# Patient Record
Sex: Female | Born: 1995 | Race: White | Hispanic: No | Marital: Single | Smoking: Current some day smoker
Health system: Southern US, Community
[De-identification: ages and names within clinical notes are randomized; demographics above are authoritative.]

## PROBLEM LIST (undated history)

## (undated) DIAGNOSIS — F32A Depression, unspecified: Secondary | ICD-10-CM

## (undated) DIAGNOSIS — E669 Obesity, unspecified: Secondary | ICD-10-CM

## (undated) DIAGNOSIS — F329 Major depressive disorder, single episode, unspecified: Secondary | ICD-10-CM

## (undated) HISTORY — PX: NO PAST SURGERIES: SHX2092

---
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
## 2017-05-16 ENCOUNTER — Emergency Department (HOSPITAL_COMMUNITY): Payer: Self-pay

## 2017-05-16 ENCOUNTER — Emergency Department (HOSPITAL_COMMUNITY)
Admission: EM | Admit: 2017-05-16 | Discharge: 2017-05-17 | Disposition: A | Discharge status: Home / Self Care | Payer: Self-pay | Attending: Emergency Medicine | Admitting: Emergency Medicine

## 2017-05-16 ENCOUNTER — Encounter (HOSPITAL_COMMUNITY): Payer: Self-pay | Admitting: *Deleted

## 2017-05-16 DIAGNOSIS — J329 Chronic sinusitis, unspecified: Secondary | ICD-10-CM | POA: Insufficient documentation

## 2017-05-16 DIAGNOSIS — J40 Bronchitis, not specified as acute or chronic: Secondary | ICD-10-CM | POA: Insufficient documentation

## 2017-05-16 DIAGNOSIS — F17210 Nicotine dependence, cigarettes, uncomplicated: Secondary | ICD-10-CM | POA: Insufficient documentation

## 2017-05-16 MED ORDER — PSEUDOEPHEDRINE HCL 60 MG PO TABS
60.0000 mg | ORAL_TABLET | Freq: Once | ORAL | Status: AC
  Administered 2017-05-17: 60 mg via ORAL
  Filled 2017-05-16: qty 1

## 2017-05-16 MED ORDER — PREDNISONE 20 MG PO TABS
40.0000 mg | ORAL_TABLET | Freq: Once | ORAL | Status: AC
  Administered 2017-05-17: 40 mg via ORAL
  Filled 2017-05-16: qty 2

## 2017-05-16 MED ORDER — DEXAMETHASONE 4 MG PO TABS: 4.0000 mg | ORAL_TABLET | Freq: Two times a day (BID) | ORAL | 0 refills | Status: DC

## 2017-05-16 MED ORDER — IBUPROFEN 800 MG PO TABS
800.0000 mg | ORAL_TABLET | Freq: Once | ORAL | Status: AC
  Administered 2017-05-17: 800 mg via ORAL
  Filled 2017-05-16: qty 1

## 2017-05-16 MED ORDER — PROMETHAZINE-DM 6.25-15 MG/5ML PO SYRP: 5.0000 mL | ORAL_SOLUTION | Freq: Four times a day (QID) | ORAL | 0 refills | Status: DC | PRN

## 2017-05-16 MED ORDER — IBUPROFEN 600 MG PO TABS: 600.0000 mg | ORAL_TABLET | Freq: Four times a day (QID) | ORAL | 0 refills | Status: DC

## 2017-05-16 MED ORDER — HYDROCOD POLST-CPM POLST ER 10-8 MG/5ML PO SUER
5.0000 mL | Freq: Once | ORAL | Status: AC
  Administered 2017-05-17: 5 mL via ORAL
  Filled 2017-05-16: qty 5

## 2017-05-16 NOTE — Discharge Instructions (Signed)
Your vital signs are within normal limits. Your oxygen level is 99%, and your chest xray is negative for acute problem. Please increase water, Gatorade, Kool-Aid, etc. Use ibuprofen with breakfast, lunch, dinner, and bedtime. Use promethazine DM every 6 hours for cough. Use decadron 2 times daily with food. Use claritin D every 12 hours (over the counter) for congestion.

## 2017-05-16 NOTE — ED Triage Notes (Signed)
Pt reports cough, congestion, sob, sore throat x 1 month. Pt reports intermittent chest pain since last night when she takes a deep breath, coughs, or sits up.
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
## 2017-05-17 NOTE — ED Notes (Signed)
Pt ambulatory to waiting room. Pt verbalized understanding of discharge instructions.   

## 2017-06-23 ENCOUNTER — Encounter (HOSPITAL_COMMUNITY): Payer: Self-pay | Admitting: *Deleted

## 2017-06-23 ENCOUNTER — Emergency Department (HOSPITAL_COMMUNITY)
Admission: EM | Admit: 2017-06-23 | Discharge: 2017-06-23 | Disposition: A | Discharge status: Home / Self Care | Payer: Self-pay | Attending: Emergency Medicine | Admitting: Emergency Medicine

## 2017-06-23 DIAGNOSIS — K029 Dental caries, unspecified: Secondary | ICD-10-CM

## 2017-06-23 DIAGNOSIS — Z791 Long term (current) use of non-steroidal anti-inflammatories (NSAID): Secondary | ICD-10-CM | POA: Insufficient documentation

## 2017-06-23 DIAGNOSIS — K0889 Other specified disorders of teeth and supporting structures: Secondary | ICD-10-CM | POA: Insufficient documentation

## 2017-06-23 DIAGNOSIS — Z79899 Other long term (current) drug therapy: Secondary | ICD-10-CM | POA: Insufficient documentation

## 2017-06-23 DIAGNOSIS — F17210 Nicotine dependence, cigarettes, uncomplicated: Secondary | ICD-10-CM | POA: Insufficient documentation

## 2017-06-23 MED ORDER — PENICILLIN V POTASSIUM 500 MG PO TABS: 500.0000 mg | ORAL_TABLET | Freq: Four times a day (QID) | ORAL | 0 refills | Status: AC

## 2017-06-23 MED ORDER — IBUPROFEN 400 MG PO TABS
600.0000 mg | ORAL_TABLET | Freq: Once | ORAL | Status: AC
  Administered 2017-06-23: 600 mg via ORAL
  Filled 2017-06-23: qty 2

## 2017-06-23 MED ORDER — ACETAMINOPHEN 500 MG PO TABS
1000.0000 mg | ORAL_TABLET | Freq: Once | ORAL | Status: AC
  Administered 2017-06-23: 1000 mg via ORAL
  Filled 2017-06-23: qty 2

## 2017-06-23 MED ORDER — PENICILLIN V POTASSIUM 250 MG PO TABS
500.0000 mg | ORAL_TABLET | Freq: Once | ORAL | Status: AC
  Administered 2017-06-23: 500 mg via ORAL
  Filled 2017-06-23: qty 2
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
## 2017-06-23 NOTE — ED Triage Notes (Signed)
Pt C/O dental pain for the past 3 days.

## 2017-06-23 NOTE — Discharge Instructions (Signed)
You can take ibuprofen 600 mg + acetaminophen 1000 mg 4 times a day for pain as needed. Take the antibiotic (penicillin) until gone. It is $4 at [employer] [facility]. You will need to see a dentist to get definitive care of your teeth problems.   Return to the ED if you get a fever, facial swelling or difficulty breathing or swallowing.

## 2019-10-13 ENCOUNTER — Other Ambulatory Visit: Payer: Self-pay

## 2019-10-13 ENCOUNTER — Emergency Department: Payer: Self-pay

## 2019-10-13 ENCOUNTER — Emergency Department
Admission: EM | Admit: 2019-10-13 | Discharge: 2019-10-13 | Disposition: A | Discharge status: Home / Self Care | Payer: Self-pay | Attending: Emergency Medicine | Admitting: Emergency Medicine

## 2019-10-13 DIAGNOSIS — Y999 Unspecified external cause status: Secondary | ICD-10-CM | POA: Diagnosis not present

## 2019-10-13 DIAGNOSIS — M79671 Pain in right foot: Secondary | ICD-10-CM | POA: Diagnosis not present

## 2019-10-13 DIAGNOSIS — Y92410 Unspecified street and highway as the place of occurrence of the external cause: Secondary | ICD-10-CM | POA: Diagnosis not present

## 2019-10-13 DIAGNOSIS — M25512 Pain in left shoulder: Secondary | ICD-10-CM | POA: Diagnosis not present

## 2019-10-13 DIAGNOSIS — Z79899 Other long term (current) drug therapy: Secondary | ICD-10-CM | POA: Diagnosis not present

## 2019-10-13 DIAGNOSIS — Y9389 Activity, other specified: Secondary | ICD-10-CM | POA: Diagnosis not present

## 2019-10-13 DIAGNOSIS — R109 Unspecified abdominal pain: Secondary | ICD-10-CM | POA: Insufficient documentation

## 2019-10-13 DIAGNOSIS — M542 Cervicalgia: Secondary | ICD-10-CM | POA: Diagnosis not present

## 2019-10-13 DIAGNOSIS — F17210 Nicotine dependence, cigarettes, uncomplicated: Secondary | ICD-10-CM | POA: Diagnosis not present

## 2019-10-13 DIAGNOSIS — R519 Headache, unspecified: Secondary | ICD-10-CM | POA: Diagnosis not present

## 2019-10-13 DIAGNOSIS — M25562 Pain in left knee: Secondary | ICD-10-CM | POA: Insufficient documentation

## 2019-10-13 DIAGNOSIS — M25511 Pain in right shoulder: Secondary | ICD-10-CM | POA: Insufficient documentation

## 2019-10-13 LAB — CBC WITH DIFFERENTIAL/PLATELET
Abs Immature Granulocytes: 0.11 10*3/uL — ABNORMAL HIGH (ref 0.00–0.07)
Basophils Absolute: 0.1 10*3/uL (ref 0.0–0.1)
Basophils Relative: 1 %
Eosinophils Absolute: 0.2 10*3/uL (ref 0.0–0.5)
Eosinophils Relative: 2 %
HCT: 44 % (ref 36.0–46.0)
Hemoglobin: 15.3 g/dL — ABNORMAL HIGH (ref 12.0–15.0)
Immature Granulocytes: 1 %
Lymphocytes Relative: 29 %
Lymphs Abs: 4.1 10*3/uL — ABNORMAL HIGH (ref 0.7–4.0)
MCH: 31.6 pg (ref 26.0–34.0)
MCHC: 34.8 g/dL (ref 30.0–36.0)
MCV: 90.9 fL (ref 80.0–100.0)
Monocytes Absolute: 0.7 10*3/uL (ref 0.1–1.0)
Monocytes Relative: 5 %
Neutro Abs: 8.8 10*3/uL — ABNORMAL HIGH (ref 1.7–7.7)
Neutrophils Relative %: 62 %
Platelets: 344 10*3/uL (ref 150–400)
RBC: 4.84 MIL/uL (ref 3.87–5.11)
RDW: 12 % (ref 11.5–15.5)
WBC: 14.1 10*3/uL — ABNORMAL HIGH (ref 4.0–10.5)
nRBC: 0 % (ref 0.0–0.2)

## 2019-10-13 LAB — BASIC METABOLIC PANEL
Anion gap: 10 (ref 5–15)
BUN: 7 mg/dL (ref 6–20)
CO2: 23 mmol/L (ref 22–32)
Calcium: 9.4 mg/dL (ref 8.9–10.3)
Chloride: 110 mmol/L (ref 98–111)
Creatinine, Ser: 0.72 mg/dL (ref 0.44–1.00)
GFR calc Af Amer: >60 mL/min (ref >60)
GFR calc non Af Amer: >60 mL/min (ref >60)
Glucose, Bld: 128 mg/dL — ABNORMAL HIGH (ref 70–99)
Potassium: 3.6 mmol/L (ref 3.5–5.1)
Sodium: 143 mmol/L (ref 135–145)

## 2019-10-13 LAB — POCT PREGNANCY, URINE: Preg Test, Ur: NEGATIVE

## 2019-10-13 LAB — HCG, QUANTITATIVE, PREGNANCY: hCG, Beta Chain, Quant, S: <1 m[IU]/mL (ref <5)
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
## 2019-10-13 MED ORDER — IOHEXOL 300 MG/ML  SOLN
100.0000 mL | Freq: Once | INTRAMUSCULAR | Status: AC | PRN
  Administered 2019-10-13: 02:00:00 100 mL via INTRAVENOUS
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
## 2021-06-01 NOTE — Congregational Nurse Program (Signed)
Client enrolled today in [facility]. Client wishes to establish care with [facility]. Appointment secured for 06/06/21 at 1PM appointment card given along with this RN's contact information for any further needs. Plan: follow up with client after initial visit to [facility].  Francee Nodal RN [employer]

## 2021-06-09 ENCOUNTER — Other Ambulatory Visit: Payer: Self-pay | Admitting: Physician Assistant

## 2021-06-09 ENCOUNTER — Encounter: Payer: Self-pay | Admitting: Physician Assistant

## 2021-06-09 ENCOUNTER — Other Ambulatory Visit: Payer: Self-pay

## 2021-06-09 ENCOUNTER — Ambulatory Visit: Payer: BC Managed Care – PPO | Admitting: Physician Assistant

## 2021-06-09 VITALS — BP 144/73 | HR 81 | Temp 97.8°F | Ht 69.0 in | Wt 234.0 lb

## 2021-06-09 DIAGNOSIS — F17200 Nicotine dependence, unspecified, uncomplicated: Secondary | ICD-10-CM

## 2021-06-09 DIAGNOSIS — E669 Obesity, unspecified: Secondary | ICD-10-CM

## 2021-06-09 DIAGNOSIS — Z7689 Persons encountering health services in other specified circumstances: Secondary | ICD-10-CM

## 2021-06-09 DIAGNOSIS — R030 Elevated blood-pressure reading, without diagnosis of hypertension: Secondary | ICD-10-CM

## 2021-06-09 NOTE — Patient Instructions (Signed)
https://www.nhlbi.nih.gov/files/docs/public/heart/dash_brief.pdf">  DASH Eating Plan DASH stands for Dietary Approaches to Stop Hypertension. The DASH eating plan is a healthy eating plan that has been shown to: Reduce high blood pressure (hypertension). Reduce your risk for type 2 diabetes, heart disease, and stroke. Help with weight loss. What are tips for following this plan? Reading food labels Check food labels for the amount of salt (sodium) per serving. Choose foods with less than 5 percent of the Daily Value of sodium. Generally, foods with less than 300 milligrams (mg) of sodium per serving fit into this eating plan. To find whole grains, look for the word "whole" as the first word in the ingredient list. Shopping Buy products labeled as "low-sodium" or "no salt added." Buy fresh foods. Avoid canned foods and pre-made or frozen meals. Cooking Avoid adding salt when cooking. Use salt-free seasonings or herbs instead of table salt or sea salt. Check with your health care provider or pharmacist before using salt substitutes. Do not fry foods. Cook foods using healthy methods such as baking, boiling, grilling, roasting, and broiling instead. Cook with heart-healthy oils, such as olive, canola, avocado, soybean, or sunflower oil. Meal planning  Eat a balanced diet that includes: 4 or more servings of fruits and 4 or more servings of vegetables each day. Try to fill one-half of your plate with fruits and vegetables. 6-8 servings of whole grains each day. Less than 6 oz (170 g) of lean meat, poultry, or fish each day. A 3-oz (85-g) serving of meat is about the same size as a deck of cards. One egg equals 1 oz (28 g). 2-3 servings of low-fat dairy each day. One serving is 1 cup (237 mL). 1 serving of nuts, seeds, or beans 5 times each week. 2-3 servings of heart-healthy fats. Healthy fats called omega-3 fatty acids are found in foods such as walnuts, flaxseeds, fortified milks, and eggs.  These fats are also found in cold-water fish, such as sardines, salmon, and mackerel. Limit how much you eat of: Canned or prepackaged foods. Food that is high in trans fat, such as some fried foods. Food that is high in saturated fat, such as fatty meat. Desserts and other sweets, sugary drinks, and other foods with added sugar. Full-fat dairy products. Do not salt foods before eating. Do not eat more than 4 egg yolks a week. Try to eat at least 2 vegetarian meals a week. Eat more home-cooked food and less restaurant, buffet, and fast food.  Lifestyle When eating at a restaurant, ask that your food be prepared with less salt or no salt, if possible. If you drink alcohol: Limit how much you use to: 0-1 drink a day for women who are not pregnant. 0-2 drinks a day for men. Be aware of how much alcohol is in your drink. In the [place] one drink equals one 12 oz bottle of beer (355 mL), one 5 oz glass of wine (148 mL), or one 1 oz glass of hard liquor (44 mL). General information Avoid eating more than 2,300 mg of salt a day. If you have hypertension, you may need to reduce your sodium intake to 1,500 mg a day. Work with your health care provider to maintain a healthy body weight or to lose weight. Ask what an ideal weight is for you. Get at least 30 minutes of exercise that causes your heart to beat faster (aerobic exercise) most days of the week. Activities may include walking, swimming, or biking. Work with your health care provider   or dietitian to adjust your eating plan to your individual calorie needs. What foods should I eat? Fruits All fresh, dried, or frozen fruit. Canned fruit in natural juice (without addedsugar). Vegetables Fresh or frozen vegetables (raw, steamed, roasted, or grilled). Low-sodium or reduced-sodium tomato and vegetable juice. Low-sodium or reduced-sodium tomatosauce and tomato paste. Low-sodium or reduced-sodium canned vegetables. Grains Whole-grain or  whole-wheat bread. Whole-grain or whole-wheat pasta. Brown rice. Oatmeal. Quinoa. Bulgur. Whole-grain and low-sodium cereals. Pita bread.Low-fat, low-sodium crackers. Whole-wheat flour tortillas. Meats and other proteins Skinless chicken or turkey. Ground chicken or turkey. Pork with fat trimmed off. Fish and seafood. Egg whites. Dried beans, peas, or lentils. Unsalted nuts, nut butters, and seeds. Unsalted canned beans. Lean cuts of beef with fat trimmed off. Low-sodium, lean precooked or cured meat, such as sausages or meatloaves. Dairy Low-fat (1%) or fat-free (skim) milk. Reduced-fat, low-fat, or fat-free cheeses. Nonfat, low-sodium ricotta or cottage cheese. Low-fat or nonfatyogurt. Low-fat, low-sodium cheese. Fats and oils Soft margarine without trans fats. Vegetable oil. Reduced-fat, low-fat, or light mayonnaise and salad dressings (reduced-sodium). Canola, safflower, olive, avocado, soybean, andsunflower oils. Avocado. Seasonings and condiments Herbs. Spices. Seasoning mixes without salt. Other foods Unsalted popcorn and pretzels. Fat-free sweets. The items listed above may not be a complete list of foods and beverages you can eat. Contact a dietitian for more information. What foods should I avoid? Fruits Canned fruit in a light or heavy syrup. Fried fruit. Fruit in cream or buttersauce. Vegetables Creamed or fried vegetables. Vegetables in a cheese sauce. Regular canned vegetables (not low-sodium or reduced-sodium). Regular canned tomato sauce and paste (not low-sodium or reduced-sodium). Regular tomato and vegetable juice(not low-sodium or reduced-sodium). Pickles. Olives. Grains Baked goods made with fat, such as croissants, muffins, or some breads. Drypasta or rice meal packs. Meats and other proteins Fatty cuts of meat. Ribs. Fried meat. Bacon. Bologna, salami, and other precooked or cured meats, such as sausages or meat loaves. Fat from the back of a pig (fatback). Bratwurst.  Salted nuts and seeds. Canned beans with added salt. Canned orsmoked fish. Whole eggs or egg yolks. Chicken or turkey with skin. Dairy Whole or 2% milk, cream, and half-and-half. Whole or full-fat cream cheese. Whole-fat or sweetened yogurt. Full-fat cheese. Nondairy creamers. Whippedtoppings. Processed cheese and cheese spreads. Fats and oils Butter. Stick margarine. Lard. Shortening. Ghee. Bacon fat. Tropical oils, suchas coconut, palm kernel, or palm oil. Seasonings and condiments Onion salt, garlic salt, seasoned salt, table salt, and sea salt. Worcestershire sauce. Tartar sauce. Barbecue sauce. Teriyaki sauce. Soy sauce, including reduced-sodium. Steak sauce. Canned and packaged gravies. Fish sauce. Oyster sauce. Cocktail sauce. Store-bought horseradish. Ketchup. Mustard. Meat flavorings and tenderizers. Bouillon cubes. Hot sauces. Pre-made or packaged marinades. Pre-made or packaged taco seasonings. Relishes. Regular saladdressings. Other foods Salted popcorn and pretzels. The items listed above may not be a complete list of foods and beverages you should avoid. Contact a dietitian for more information. Where to find more information National Heart, Lung, and Blood Institute: www.nhlbi.nih.gov American Heart Association: www.heart.org Academy of Nutrition and Dietetics: www.eatright.org National Kidney Foundation: www.kidney.org Summary The DASH eating plan is a healthy eating plan that has been shown to reduce high blood pressure (hypertension). It may also reduce your risk for type 2 diabetes, heart disease, and stroke. When on the DASH eating plan, aim to eat more fresh fruits and vegetables, whole grains, lean proteins, low-fat dairy, and heart-healthy fats. With the DASH eating plan, you should limit salt (sodium) intake to 2,300   mg a day. If you have hypertension, you may need to reduce your sodium intake to 1,500 mg a day. Work with your health care provider or dietitian to adjust  your eating plan to your individual calorie needs. This information is not intended to replace advice given to you by your health care provider. Make sure you discuss any questions you have with your healthcare provider. Document Revised: 11/07/2019 Document Reviewed: 11/07/2019 Elsevier Patient Education  2022 Elsevier Inc.  

## 2021-06-09 NOTE — Progress Notes (Signed)
BP (!) 144/73   Pulse 81   Temp 97.8 F (36.6 C)   Ht 5\' 9"  (1.753 m)   Wt 234 lb (106.1 kg)   SpO2 97%   BMI 34.56 kg/m    Subjective:    Patient ID: , female    DOB: [birth year], 25 y.o.   MRN: [id number]  HPI: Ashley [name] is a 25 y.o. female presenting on 06/09/2021 for No chief complaint on file.   HPI   Pt had a negative covid 19 screening questionnaire.    Pt is 24yoF who presents to establish care.    Pt has no complaints today.  She is not currently getting counseling but says she is not having any anxiety or depression.    She says she is not taking any meds or using any contraception currently.  She says she is not currently sexually active.    She doesn't work.    She says she got covid vaccination but she doesn't have her card with her.     Relevant past medical, surgical, family and social history reviewed and updated as indicated. Interim medical history since our last visit reviewed. Allergies and medications reviewed and updated.   No current outpatient medications on file.    Review of Systems  Per HPI unless specifically indicated above     Objective:    BP (!) 144/73   Pulse 81   Temp 97.8 F (36.6 C)   Ht 5\' 9"  (1.753 m)   Wt 234 lb (106.1 kg)   SpO2 97%   BMI 34.56 kg/m   Wt Readings from Last 3 Encounters:  06/09/21 234 lb (106.1 kg)  10/13/19 251 lb (113.9 kg)  06/23/17 230 lb (104.3 kg) (99 %, Z= 2.32)*   * Growth percentiles are based on CDC (Girls, 2-20 Years) data.    Physical Exam Vitals reviewed.  Constitutional:      General: She is not in acute distress.    Appearance: She is well-developed. She is obese. She is not ill-appearing.  HENT:     Head: Normocephalic and atraumatic.     Right Ear: Tympanic membrane, ear canal and external ear normal.     Left Ear: Tympanic membrane, ear canal and external ear normal.  Eyes:     Extraocular Movements: Extraocular movements intact.      Conjunctiva/sclera: Conjunctivae normal.     Pupils: Pupils are equal, round, and reactive to light.  Neck:     Thyroid: No thyromegaly.  Cardiovascular:     Rate and Rhythm: Normal rate and regular rhythm.  Pulmonary:     Effort: Pulmonary effort is normal.     Breath sounds: Normal breath sounds.  Abdominal:     General: Bowel sounds are normal.     Palpations: Abdomen is soft. There is no mass.     Tenderness: There is no abdominal tenderness.  Musculoskeletal:     Cervical back: Neck supple.     Right lower leg: No edema.     Left lower leg: No edema.  Lymphadenopathy:     Cervical: No cervical adenopathy.  Skin:    General: Skin is warm and dry.     Comments: Fingernails not bitten  Neurological:     Mental Status: She is alert and oriented to person, place, and time.     Motor: No weakness or tremor.     Gait: Gait normal.     Deep Tendon Reflexes:  Reflex Scores:      Patellar reflexes are 2+ on the right side and 2+ on the left side. Psychiatric:        Attention and Perception: Attention normal.        Speech: Speech normal.        Behavior: Behavior normal. Behavior is cooperative.           Assessment & Plan:    Encounter Diagnoses  Name Primary?   Encounter to establish care Yes   Tobacco use disorder    Obesity, unspecified classification, unspecified obesity type, unspecified whether serious comorbidity present    Elevated blood pressure reading         -pt counseled on smoking cessation -pt is encouraged on regular exercise and healthy diet - discussed mild elevation in BP.    Counsled on healthy lifestyle and gave reading information on DASH diet -pt is encouraged to use condoms if she becomes sexually active or return to [facility] for contraceptives (she has FP medicaid) -pt will follow up in 6 months to recheck bp.  She is to contact office sooner prn

## 2021-07-20 ENCOUNTER — Telehealth: Payer: Self-pay

## 2021-07-20 NOTE — Telephone Encounter (Signed)
Client of [facility] and current with [facility]. Called for follow up and wellness check in.  Client states she is doing well and is aware of her follow up visit 12/2021 with [facility]. Discussed that if client has a medical concern before that appointment she may call and schedule to be seen. Client reports understanding. Client states she has her medications and has no other needs at this time.  Client made aware that [facility] now has a food market that is available for our clients once a month if needed to supplement food.   Follow as needed.  Francee Nodal RN Clara Gunn/[facility]

## 2021-11-27 ENCOUNTER — Other Ambulatory Visit: Payer: Self-pay

## 2021-11-27 ENCOUNTER — Encounter (HOSPITAL_COMMUNITY): Payer: Self-pay

## 2021-11-27 ENCOUNTER — Emergency Department (HOSPITAL_COMMUNITY)
Admission: EM | Admit: 2021-11-27 | Discharge: 2021-11-27 | Disposition: A | Discharge status: Home / Self Care | Payer: BC Managed Care – PPO | Attending: Emergency Medicine | Admitting: Emergency Medicine

## 2021-11-27 DIAGNOSIS — F1010 Alcohol abuse, uncomplicated: Secondary | ICD-10-CM | POA: Insufficient documentation

## 2021-11-27 DIAGNOSIS — F17210 Nicotine dependence, cigarettes, uncomplicated: Secondary | ICD-10-CM | POA: Insufficient documentation

## 2021-11-27 DIAGNOSIS — N9489 Other specified conditions associated with female genital organs and menstrual cycle: Secondary | ICD-10-CM | POA: Insufficient documentation

## 2021-11-27 DIAGNOSIS — F1910 Other psychoactive substance abuse, uncomplicated: Secondary | ICD-10-CM | POA: Insufficient documentation

## 2021-11-27 DIAGNOSIS — F1490 Cocaine use, unspecified, uncomplicated: Secondary | ICD-10-CM | POA: Insufficient documentation

## 2021-11-27 LAB — CBC WITH DIFFERENTIAL/PLATELET
Abs Immature Granulocytes: 0.04 10*3/uL (ref 0.00–0.07)
Basophils Absolute: 0.1 10*3/uL (ref 0.0–0.1)
Basophils Relative: 1 %
Eosinophils Absolute: 0.7 10*3/uL — ABNORMAL HIGH (ref 0.0–0.5)
Eosinophils Relative: 6 %
HCT: 45.7 % (ref 36.0–46.0)
Hemoglobin: 15.8 g/dL — ABNORMAL HIGH (ref 12.0–15.0)
Immature Granulocytes: 0 %
Lymphocytes Relative: 37 %
Lymphs Abs: 4 10*3/uL (ref 0.7–4.0)
MCH: 32.6 pg (ref 26.0–34.0)
MCHC: 34.6 g/dL (ref 30.0–36.0)
MCV: 94.4 fL (ref 80.0–100.0)
Monocytes Absolute: 0.7 10*3/uL (ref 0.1–1.0)
Monocytes Relative: 7 %
Neutro Abs: 5.3 10*3/uL (ref 1.7–7.7)
Neutrophils Relative %: 49 %
Platelets: 299 10*3/uL (ref 150–400)
RBC: 4.84 MIL/uL (ref 3.87–5.11)
RDW: 12.5 % (ref 11.5–15.5)
WBC: 10.8 10*3/uL — ABNORMAL HIGH (ref 4.0–10.5)
nRBC: 0 % (ref 0.0–0.2)

## 2021-11-27 LAB — URINALYSIS, ROUTINE W REFLEX MICROSCOPIC
Bilirubin Urine: NEGATIVE
Glucose, UA: NEGATIVE mg/dL
Hgb urine dipstick: NEGATIVE
Ketones, ur: NEGATIVE mg/dL
Leukocytes,Ua: NEGATIVE
Nitrite: NEGATIVE
Protein, ur: NEGATIVE mg/dL
Specific Gravity, Urine: 1 — ABNORMAL LOW (ref 1.005–1.030)
pH: 6 (ref 5.0–8.0)

## 2021-11-27 LAB — COMPREHENSIVE METABOLIC PANEL WITH GFR
ALT: 28 U/L (ref 0–44)
AST: 28 U/L (ref 15–41)
Albumin: 4 g/dL (ref 3.5–5.0)
Alkaline Phosphatase: 94 U/L (ref 38–126)
Anion gap: 8 (ref 5–15)
BUN: 10 mg/dL (ref 6–20)
CO2: 26 mmol/L (ref 22–32)
Calcium: 9.7 mg/dL (ref 8.9–10.3)
Chloride: 104 mmol/L (ref 98–111)
Creatinine, Ser: 0.8 mg/dL (ref 0.44–1.00)
GFR, Estimated: >60 mL/min
Glucose, Bld: 94 mg/dL (ref 70–99)
Potassium: 3.5 mmol/L (ref 3.5–5.1)
Sodium: 138 mmol/L (ref 135–145)
Total Bilirubin: 1 mg/dL (ref 0.3–1.2)
Total Protein: 7.4 g/dL (ref 6.5–8.1)

## 2021-11-27 LAB — I-STAT BETA HCG BLOOD, ED (MC, WL, AP ONLY): I-stat hCG, quantitative: <5 m[IU]/mL (ref <5)

## 2021-11-27 LAB — ETHANOL: Alcohol, Ethyl (B): <10 mg/dL (ref <10)

## 2021-11-27 LAB — LIPASE, BLOOD: Lipase: 30 U/L (ref 11–51)

## 2021-11-27 NOTE — ED Provider Notes (Signed)
[facility] EMERGENCY DEPARTMENT Provider Note  CSN: [id number] Arrival date & time: 11/27/21 1231    History Chief Complaint  Patient presents with   Alcohol Intoxication    Ashley [name] is a 25 y.o. female with history of alcohol and drug abuse brought to the ED by mother after she was called by [facility] who found the patient on the side of the road with her luggage. Apparently she had been kicked out of the house she was living in, reportedly with a drug dealer. She has been drinking beet, using cocaine and xanax. No alcohol since last night, she did take a xanax today. She is depressed but not having SI/HI or hallucinations. She is not very interested in an inpatient treatment program.    Past Medical History:  Diagnosis Date   Depression    Obesity     Past Surgical History:  Procedure Laterality Date   NO PAST SURGERIES      Family History  Problem Relation Age of Onset   Alcohol abuse Mother     Social History   Tobacco Use   Smoking status: Every Day    Types: Cigarettes   Smokeless tobacco: Never  Vaping Use   Vaping Use: Never used  Substance Use Topics   Alcohol use: No   Drug use: No     Home Medications Prior to Admission medications   Not on File     Allergies    Patient has no known allergies.   Review of Systems   Review of Systems A comprehensive review of systems was completed and negative except as noted in HPI.    Physical Exam BP (!) 125/96   Pulse 64   Temp 97.8 F (36.6 C) (Oral)   Resp 20   SpO2 100%   Physical Exam Vitals and nursing note reviewed.  HENT:     Head: Normocephalic.     Nose: Nose normal.  Eyes:     Extraocular Movements: Extraocular movements intact.  Pulmonary:     Effort: Pulmonary effort is normal.  Musculoskeletal:        General: Normal range of motion.     Cervical back: Neck supple.  Skin:    Findings: No rash (on exposed skin).  Neurological:     Mental Status: She is alert and  oriented to person, place, and time.  Psychiatric:     Comments: Flat affect     ED Results / Procedures / Treatments   Labs (all labs ordered are listed, but only abnormal results are displayed) Labs Reviewed  URINALYSIS, ROUTINE W REFLEX MICROSCOPIC - Abnormal; Notable for the following components:      Result Value   Color, Urine COLORLESS (*)    Specific Gravity, Urine 1.000 (*)    All other components within normal limits  CBC WITH DIFFERENTIAL/PLATELET - Abnormal; Notable for the following components:   WBC 10.8 (*)    Hemoglobin 15.8 (*)    Eosinophils Absolute 0.7 (*)    All other components within normal limits  ETHANOL  COMPREHENSIVE METABOLIC PANEL  LIPASE, BLOOD  RAPID URINE DRUG SCREEN, HOSP PERFORMED  I-STAT BETA HCG BLOOD, ED ([facility], [facility], [facility] ONLY)  CBG MONITORING, ED    EKG None  Radiology No results found.  Procedures Procedures  Medications Ordered in the ED Medications - No data to display   MDM Rules/Calculators/A&P MDM Discussed with patient and mother that we do not offer inpatient substance abuse treatments. Psychologist, counselling  for local residential treatment facilities. Mother has told the patient, she needs to get some help before she will allow the patient to return to her home. I informed the patient that ultimately it is her decision to seek help. She and mother will discuss options and let me know what direction they want to take from here. She has not other acute medical concerns.   ED Course  I have reviewed the triage vital signs and the nursing notes.  Pertinent labs & imaging results that were available during my care of the patient were reviewed by me and considered in my medical decision making (see chart for details).  Clinical Course as of 11/27/21 1441  Sun Nov 27, 2021  1439 Patient is unable to decide on going to a residential treatment facility. Mother is willing to take her home for now to sober up from Xanax, etOH is neg.  CBC, CMP are neg. She will be given resource guide to help her navigate substance abuse treatment if she chooses to do so. RTED for any other concerns.  [CS]    Clinical Course User Index [CS] Pollyann Savoy, MD    Final Clinical Impression(s) / ED Diagnoses Final diagnoses:  Substance abuse [facility])    Rx / DC Orders ED Discharge Orders     None        Pollyann Savoy, MD 11/27/21 1441

## 2021-11-27 NOTE — ED Provider Notes (Signed)
Emergency Medicine Provider Triage Evaluation Note  Ashley [name] , a 25 y.o. female  was evaluated in triage.  Pt complains of alcohol abuse.  Patient arrives with mother who reports that the patient needs help with alcohol.  The patient states that she drinks 12 pack a day, uses 1 g of cocaine a day.  Endorses snorting cocaine.  Also endorses 1.5 bar of Xanax today.  Per the mother she was found on the side of the road this morning with her luggage.  Patient states that her boyfriend kicked her out of the house.  She states that last drug use was 4 days ago, last drink was last night, however the patient appears acutely intoxicated.  She is alert.  Denies any pain.  Denies ever having hematemesis, melena, hematochezia or overdose.  She denies SI, HI, AVH.  She denies symptoms of withdrawal at this time  Review of Systems  Positive: See above Negative:   Physical Exam  BP (!) 125/96   Pulse 64   Temp 97.8 F (36.6 C) (Oral)   Resp 20   SpO2 100%  Gen:   Awake, no distress   Resp:  Normal effort  MSK:   Moves extremities without difficulty  Other:  Abdomen is rounded, soft and nontender.  No obvious tremors, diaphoresis. Medical Decision Making  Medically screening exam initiated at 12:50 PM.  Appropriate orders placed.  Sible Dione Housekeeper was informed that the remainder of the evaluation will be completed by another provider, this initial triage assessment does not replace that evaluation, and the importance of remaining in the ED until their evaluation is complete.     Cristopher Peru, PA-C 11/27/21 1251    Pollyann Savoy, MD 11/27/21 1357

## 2021-11-27 NOTE — ED Triage Notes (Addendum)
Patient arrived with her mother and reports drinking a 12 pack a day and daly use of cocaine, maybe snorting a gram a day. Also doing xanax, 1.5 bars a day. Patient found on the side of the road with her luggage. Last drink last night. Last drug use 4 days ago. Patient alert and denies pain. Denies SI/HI

## 2022-01-10 ENCOUNTER — Ambulatory Visit: Payer: BC Managed Care – PPO | Admitting: Physician Assistant

## 2022-01-25 ENCOUNTER — Other Ambulatory Visit: Payer: Self-pay

## 2022-01-25 ENCOUNTER — Ambulatory Visit
Admission: EM | Admit: 2022-01-25 | Discharge: 2022-01-25 | Disposition: A | Discharge status: Home / Self Care | Payer: Self-pay | Attending: Family Medicine | Admitting: Family Medicine

## 2022-01-25 DIAGNOSIS — K0889 Other specified disorders of teeth and supporting structures: Secondary | ICD-10-CM

## 2022-01-25 MED ORDER — AMOXICILLIN 875 MG PO TABS: 875.0000 mg | ORAL_TABLET | Freq: Two times a day (BID) | ORAL | 0 refills | Status: AC

## 2022-01-25 MED ORDER — HYDROCODONE-ACETAMINOPHEN 5-325 MG PO TABS: 1.0000 | ORAL_TABLET | Freq: Four times a day (QID) | ORAL | 0 refills | Status: AC | PRN

## 2022-01-25 NOTE — ED Triage Notes (Signed)
Pt reports dental pain and swelling in left side of face x 3 days. Ibuprofen gives no relief.

## 2022-01-25 NOTE — ED Provider Notes (Signed)
°  [place]   [id number] 01/25/22 Arrival Time: 0908  ASSESSMENT & PLAN:  1. Pain, dental    No sign of abscess requiring I&D at this time. Discussed.  Begin: Meds ordered this encounter  Medications   amoxicillin (AMOXIL) 875 MG tablet    Sig: Take 1 tablet (875 mg total) by mouth 2 (two) times daily for 10 days.    Dispense:  20 tablet    Refill:  0   HYDROcodone-acetaminophen (NORCO/VICODIN) 5-325 MG tablet    Sig: Take 1 tablet by mouth every 6 (six) hours as needed for moderate pain or severe pain.    Dispense:  8 tablet    Refill:  0   [place] Controlled Substances Registry consulted for this patient. I feel the risk/benefit ratio today is favorable for proceeding with this prescription for a controlled substance. Medication sedation precautions given.  Dental resource written instructions given. She will schedule dental evaluation as soon as possible if not improving over the next 24-48 hours.  Reviewed expectations re: course of current medical issues. Questions answered. Outlined signs and symptoms indicating need for more acute intervention. Patient verbalized understanding. After Visit Summary given.   SUBJECTIVE:  Ashley [name] is a 26 y.o. female who reports gradual onset of left lower dental pain described as aching/throbbing. Present for a few days. Fever: absent. Tolerating PO intake but reports pain with chewing. Normal swallowing. She does not see a dentist regularly. No neck swelling or pain. OTC analgesics without relief.  OBJECTIVE: Vitals:   01/25/22 1036  BP: (!) 155/80  Pulse: 74  Resp: 16  Temp: 98.1 F (36.7 C)  TempSrc: Oral  SpO2: 98%    General appearance: alert; no distress HENT: normocephalic; atraumatic; dentition: fair; left lower gum without areas of fluctuance, drainage, or bleeding and with tenderness; normal jaw movement without difficulty Neck: supple without LAD; FROM; trachea midline Lungs: normal respirations;  unlabored; speaks full sentences without difficulty Skin: warm and dry Psychological: alert and cooperative; normal mood and affect  No Known Allergies  Past Medical History:  Diagnosis Date   Depression    Obesity    Social History   Socioeconomic History   Marital status: Single    Spouse name: Not on file   Number of children: Not on file   Years of education: Not on file   Highest education level: Not on file  Occupational History   Occupation: Ship broker    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  Tobacco Use   Smoking status: Some Days    Types: Cigarettes   Smokeless tobacco: Never  Vaping Use   Vaping Use: Never used  Substance and Sexual Activity   Alcohol use: Yes   Drug use: No   Sexual activity: Yes  Other Topics Concern   Not on file  Social History Narrative   Not on file   Social Determinants of Health   Financial Resource Strain: Not on file  Food Insecurity: Not on file  Transportation Needs: Not on file  Physical Activity: Not on file  Stress: Not on file  Social Connections: Not on file  Intimate Partner Violence: Not on file   Family History  Problem Relation Age of Onset   Alcohol abuse Mother    Past Surgical History:  Procedure Laterality Date   NO PAST SURGERIES        Vanessa Kick, MD 01/25/22 1105

## 2022-01-25 NOTE — Discharge Instructions (Addendum)
